# Patient Record
Sex: Female | Born: 1970 | Race: Black or African American | Hispanic: No | Marital: Single | Smoking: Former smoker
Health system: Southern US, Community
[De-identification: ages and names within clinical notes are randomized; demographics above are authoritative.]

## PROBLEM LIST (undated history)

## (undated) DIAGNOSIS — D649 Anemia, unspecified: Secondary | ICD-10-CM

## (undated) DIAGNOSIS — J45909 Unspecified asthma, uncomplicated: Secondary | ICD-10-CM

## (undated) DIAGNOSIS — G43909 Migraine, unspecified, not intractable, without status migrainosus: Secondary | ICD-10-CM

## (undated) HISTORY — PX: FACIAL COSMETIC SURGERY: SHX629

## (undated) HISTORY — DX: Anemia, unspecified: D64.9

## (undated) HISTORY — DX: Unspecified asthma, uncomplicated: J45.909

## (undated) HISTORY — DX: Migraine, unspecified, not intractable, without status migrainosus: G43.909

---

## 2002-01-13 ENCOUNTER — Emergency Department (HOSPITAL_COMMUNITY): Admission: EM | Admit: 2002-01-13 | Discharge: 2002-01-13 | Payer: Self-pay | Admitting: Emergency Medicine

## 2002-01-13 ENCOUNTER — Encounter: Payer: Self-pay | Admitting: Emergency Medicine

## 2002-01-14 ENCOUNTER — Ambulatory Visit (HOSPITAL_COMMUNITY): Admission: RE | Admit: 2002-01-14 | Discharge: 2002-01-14 | Payer: Self-pay | Admitting: Emergency Medicine

## 2002-01-14 ENCOUNTER — Encounter: Payer: Self-pay | Admitting: Emergency Medicine

## 2004-11-29 ENCOUNTER — Emergency Department: Payer: Self-pay | Admitting: Unknown Physician Specialty

## 2008-03-21 ENCOUNTER — Emergency Department: Payer: Self-pay | Admitting: Emergency Medicine

## 2008-04-06 ENCOUNTER — Emergency Department: Payer: Self-pay | Admitting: Emergency Medicine

## 2009-01-06 ENCOUNTER — Emergency Department: Payer: Self-pay | Admitting: Emergency Medicine

## 2009-06-20 ENCOUNTER — Emergency Department: Payer: Self-pay | Admitting: Emergency Medicine

## 2009-12-21 ENCOUNTER — Emergency Department: Payer: Self-pay | Admitting: Emergency Medicine

## 2011-08-17 ENCOUNTER — Emergency Department: Payer: Self-pay | Admitting: Emergency Medicine

## 2011-08-17 LAB — URINALYSIS, COMPLETE
Bilirubin,UR: NEGATIVE
Glucose,UR: NEGATIVE mg/dL (ref 0–75)
Ketone: NEGATIVE
Leukocyte Esterase: NEGATIVE
Nitrite: NEGATIVE
Ph: 5 (ref 4.5–8.0)
Protein: NEGATIVE
RBC,UR: 2 /HPF (ref 0–5)
Specific Gravity: 1.016 (ref 1.003–1.030)
Squamous Epithelial: 3
WBC UR: NONE SEEN /HPF (ref 0–5)

## 2011-08-17 LAB — PREGNANCY, URINE: Pregnancy Test, Urine: NEGATIVE m[IU]/mL

## 2011-12-07 ENCOUNTER — Emergency Department: Payer: Self-pay | Admitting: Unknown Physician Specialty

## 2013-07-31 ENCOUNTER — Emergency Department: Payer: Self-pay | Admitting: Emergency Medicine

## 2013-07-31 LAB — URINALYSIS, COMPLETE
Bacteria: NONE SEEN
Bilirubin,UR: NEGATIVE
Glucose,UR: NEGATIVE mg/dL (ref 0–75)
Ketone: NEGATIVE
Leukocyte Esterase: NEGATIVE
Nitrite: NEGATIVE
Ph: 6 (ref 4.5–8.0)
Protein: NEGATIVE
RBC,UR: <1 /HPF (ref 0–5)
Specific Gravity: 1.001 (ref 1.003–1.030)
Squamous Epithelial: 1
WBC UR: NONE SEEN /HPF (ref 0–5)

## 2013-07-31 LAB — CBC
HCT: 40.2 % (ref 35.0–47.0)
HGB: 13.6 g/dL (ref 12.0–16.0)
MCH: 31.5 pg (ref 26.0–34.0)
MCHC: 33.9 g/dL (ref 32.0–36.0)
MCV: 93 fL (ref 80–100)
Platelet: 163 10*3/uL (ref 150–440)
RBC: 4.33 10*6/uL (ref 3.80–5.20)
RDW: 12.9 % (ref 11.5–14.5)
WBC: 17.4 10*3/uL — ABNORMAL HIGH (ref 3.6–11.0)

## 2013-07-31 LAB — LIPASE, BLOOD: LIPASE: 224 U/L (ref 73–393)

## 2013-07-31 LAB — BASIC METABOLIC PANEL
Anion Gap: 6 — ABNORMAL LOW (ref 7–16)
BUN: 5 mg/dL — ABNORMAL LOW (ref 7–18)
Calcium, Total: 9.2 mg/dL (ref 8.5–10.1)
Chloride: 103 mmol/L (ref 98–107)
Co2: 24 mmol/L (ref 21–32)
Creatinine: 0.95 mg/dL (ref 0.60–1.30)
EGFR (African American): >60
EGFR (Non-African Amer.): >60
Glucose: 101 mg/dL — ABNORMAL HIGH (ref 65–99)
Osmolality: 264 (ref 275–301)
Potassium: 3.3 mmol/L — ABNORMAL LOW (ref 3.5–5.1)
Sodium: 133 mmol/L — ABNORMAL LOW (ref 136–145)

## 2013-07-31 LAB — PREGNANCY, URINE: PREGNANCY TEST, URINE: NEGATIVE m[IU]/mL

## 2013-09-22 ENCOUNTER — Emergency Department: Payer: Self-pay | Admitting: Emergency Medicine

## 2014-12-23 LAB — HM PAP SMEAR: HM Pap smear: NEGATIVE

## 2015-06-09 ENCOUNTER — Encounter: Payer: Self-pay | Admitting: *Deleted

## 2015-06-09 ENCOUNTER — Emergency Department: Payer: No Typology Code available for payment source

## 2015-06-09 ENCOUNTER — Emergency Department
Admission: EM | Admit: 2015-06-09 | Discharge: 2015-06-09 | Disposition: A | Discharge status: Home / Self Care | Payer: No Typology Code available for payment source | Attending: Emergency Medicine | Admitting: Emergency Medicine

## 2015-06-09 DIAGNOSIS — Y939 Activity, unspecified: Secondary | ICD-10-CM | POA: Insufficient documentation

## 2015-06-09 DIAGNOSIS — G44319 Acute post-traumatic headache, not intractable: Secondary | ICD-10-CM | POA: Insufficient documentation

## 2015-06-09 DIAGNOSIS — S022XXA Fracture of nasal bones, initial encounter for closed fracture: Secondary | ICD-10-CM | POA: Insufficient documentation

## 2015-06-09 DIAGNOSIS — Y999 Unspecified external cause status: Secondary | ICD-10-CM | POA: Diagnosis not present

## 2015-06-09 DIAGNOSIS — M7918 Myalgia, other site: Secondary | ICD-10-CM

## 2015-06-09 DIAGNOSIS — S0990XA Unspecified injury of head, initial encounter: Secondary | ICD-10-CM | POA: Diagnosis present

## 2015-06-09 DIAGNOSIS — Y92410 Unspecified street and highway as the place of occurrence of the external cause: Secondary | ICD-10-CM | POA: Diagnosis not present

## 2015-06-09 DIAGNOSIS — S0083XA Contusion of other part of head, initial encounter: Secondary | ICD-10-CM | POA: Diagnosis not present

## 2015-06-09 MED ORDER — TRAMADOL HCL 50 MG PO TABS: 50.0000 mg | ORAL_TABLET | Freq: Four times a day (QID) | ORAL | Status: DC | PRN

## 2015-06-09 MED ORDER — IBUPROFEN 600 MG PO TABS: 600.0000 mg | ORAL_TABLET | Freq: Four times a day (QID) | ORAL | Status: DC | PRN

## 2015-06-09 MED ORDER — CYCLOBENZAPRINE HCL 10 MG PO TABS: 10.0000 mg | ORAL_TABLET | Freq: Three times a day (TID) | ORAL | Status: AC | PRN

## 2015-06-09 MED ORDER — TRAMADOL HCL 50 MG PO TABS
50.0000 mg | ORAL_TABLET | Freq: Once | ORAL | Status: AC
  Administered 2015-06-09: 50 mg via ORAL
  Filled 2015-06-09: qty 1

## 2015-06-09 MED ORDER — ACETAMINOPHEN 325 MG PO TABS
650.0000 mg | ORAL_TABLET | Freq: Once | ORAL | Status: AC
  Administered 2015-06-09: 650 mg via ORAL
  Filled 2015-06-09: qty 2

## 2015-06-09 MED ORDER — CYCLOBENZAPRINE HCL 10 MG PO TABS
10.0000 mg | ORAL_TABLET | Freq: Once | ORAL | Status: AC
  Administered 2015-06-09: 10 mg via ORAL
  Filled 2015-06-09: qty 1
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
## 2015-06-09 NOTE — Discharge Instructions (Signed)
Follow-up with the primary care provider for choice for symptoms that are not improving over the week. Return to the emergency department for symptoms that change or worsen if she unable schedule an appointment.
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
## 2015-06-09 NOTE — ED Notes (Signed)
RN entered room to administer meds and d/c pt. Pt not in room, pt's purse still in room. Will wait for few mins to see if pt returns.

## 2015-06-11 ENCOUNTER — Emergency Department
Admission: EM | Admit: 2015-06-11 | Discharge: 2015-06-11 | Disposition: A | Discharge status: Home / Self Care | Payer: No Typology Code available for payment source | Attending: Emergency Medicine | Admitting: Emergency Medicine

## 2015-06-11 DIAGNOSIS — Y939 Activity, unspecified: Secondary | ICD-10-CM | POA: Diagnosis not present

## 2015-06-11 DIAGNOSIS — Y999 Unspecified external cause status: Secondary | ICD-10-CM | POA: Diagnosis not present

## 2015-06-11 DIAGNOSIS — Z791 Long term (current) use of non-steroidal anti-inflammatories (NSAID): Secondary | ICD-10-CM | POA: Diagnosis not present

## 2015-06-11 DIAGNOSIS — S022XXD Fracture of nasal bones, subsequent encounter for fracture with routine healing: Secondary | ICD-10-CM | POA: Insufficient documentation

## 2015-06-11 DIAGNOSIS — S0083XD Contusion of other part of head, subsequent encounter: Secondary | ICD-10-CM | POA: Diagnosis not present

## 2015-06-11 DIAGNOSIS — S0993XD Unspecified injury of face, subsequent encounter: Secondary | ICD-10-CM | POA: Diagnosis present

## 2015-06-11 DIAGNOSIS — Y92410 Unspecified street and highway as the place of occurrence of the external cause: Secondary | ICD-10-CM | POA: Insufficient documentation

## 2015-06-11 MED ORDER — OXYCODONE-ACETAMINOPHEN 5-325 MG PO TABS
1.0000 | ORAL_TABLET | Freq: Once | ORAL | Status: AC
  Administered 2015-06-11: 1 via ORAL
  Filled 2015-06-11: qty 1

## 2015-06-11 MED ORDER — OXYCODONE-ACETAMINOPHEN 10-325 MG PO TABS: 1.0000 | ORAL_TABLET | Freq: Four times a day (QID) | ORAL | Status: AC | PRN
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
## 2015-06-28 ENCOUNTER — Emergency Department
Admission: EM | Admit: 2015-06-28 | Discharge: 2015-06-28 | Disposition: A | Discharge status: Home / Self Care | Payer: No Typology Code available for payment source

## 2015-06-29 ENCOUNTER — Emergency Department
Admission: EM | Admit: 2015-06-29 | Discharge: 2015-06-29 | Disposition: A | Discharge status: Home / Self Care | Payer: No Typology Code available for payment source | Attending: Emergency Medicine | Admitting: Emergency Medicine

## 2015-06-29 DIAGNOSIS — M549 Dorsalgia, unspecified: Secondary | ICD-10-CM | POA: Diagnosis present

## 2015-06-29 DIAGNOSIS — Z5321 Procedure and treatment not carried out due to patient leaving prior to being seen by health care provider: Secondary | ICD-10-CM | POA: Diagnosis not present
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
## 2015-08-17 ENCOUNTER — Emergency Department
Admission: EM | Admit: 2015-08-17 | Discharge: 2015-08-17 | Disposition: A | Discharge status: Home / Self Care | Payer: Self-pay | Attending: Emergency Medicine | Admitting: Emergency Medicine

## 2015-08-17 ENCOUNTER — Encounter: Payer: Self-pay | Admitting: Emergency Medicine

## 2015-08-17 ENCOUNTER — Emergency Department: Payer: Self-pay

## 2015-08-17 DIAGNOSIS — Z79899 Other long term (current) drug therapy: Secondary | ICD-10-CM | POA: Insufficient documentation

## 2015-08-17 DIAGNOSIS — M25511 Pain in right shoulder: Secondary | ICD-10-CM | POA: Insufficient documentation

## 2015-08-17 DIAGNOSIS — Z791 Long term (current) use of non-steroidal anti-inflammatories (NSAID): Secondary | ICD-10-CM | POA: Insufficient documentation

## 2015-08-17 DIAGNOSIS — M7918 Myalgia, other site: Secondary | ICD-10-CM

## 2015-08-17 DIAGNOSIS — M791 Myalgia: Secondary | ICD-10-CM | POA: Insufficient documentation

## 2015-08-17 MED ORDER — KETOROLAC TROMETHAMINE 60 MG/2ML IM SOLN
30.0000 mg | Freq: Once | INTRAMUSCULAR | Status: AC
  Administered 2015-08-17: 30 mg via INTRAMUSCULAR
  Filled 2015-08-17: qty 2

## 2015-08-17 MED ORDER — KETOROLAC TROMETHAMINE 10 MG PO TABS: 10.0000 mg | ORAL_TABLET | Freq: Three times a day (TID) | ORAL | Status: DC

## 2015-08-17 MED ORDER — CYCLOBENZAPRINE HCL 5 MG PO TABS: 5.0000 mg | ORAL_TABLET | Freq: Three times a day (TID) | ORAL | Status: DC | PRN

## 2015-08-17 NOTE — Discharge Instructions (Signed)
Your shoulder exam and neck x-rays are normal. You likely have some muscle strain in the upper back causing some irritation of the shoulder and nerves in the upper back. Take the prescription meds as directed. Apply ice and heat compresses for pain relief. Follow-up with your chiropractor weekly for adjustments. Consider seeing Dr. Rosita Kea (ortho) if symptoms continue.  Cryotherapy Cryotherapy is when you put ice on your injury. Ice helps lessen pain and puffiness (swelling) after an injury. Ice works the best when you start using it in the first 24 to 48 hours after an injury. HOME CARE  Put a dry or damp towel between the ice pack and your skin.  You may press gently on the ice pack.  Leave the ice on for no more than 10 to 20 minutes at a time.  Check your skin after 5 minutes to make sure your skin is okay.  Rest at least 20 minutes between ice pack uses.  Stop using ice when your skin loses feeling (numbness).  Do not use ice on someone who cannot tell you when it hurts. This includes small children and people with memory problems (dementia). GET HELP RIGHT AWAY IF:  You have white spots on your skin.  Your skin turns blue or pale.  Your skin feels waxy or hard.  Your puffiness gets worse. MAKE SURE YOU:   Understand these instructions.  Will watch your condition.  Will get help right away if you are not doing well or get worse.   This information is not intended to replace advice given to you by your health care provider. Make sure you discuss any questions you have with your health care provider.   Document Released: 07/12/2007 Document Revised: 04/17/2011 Document Reviewed: 09/15/2010 Elsevier Interactive Patient Education 2016 Elsevier Inc[employer]  [employer] Therapy Heat therapy can help ease sore, stiff, injured, and tight muscles and joints. Heat relaxes your muscles, which may help ease your pain. Heat therapy should only be used on old, pre-existing, or long-lasting (chronic)  injuries. Do not use heat therapy unless told by your doctor. HOW TO USE HEAT THERAPY There are several different kinds of heat therapy, including:  Moist heat pack.  Warm water bath.  Hot water bottle.  Electric heating pad.  Heated gel pack.  Heated wrap.  Electric heating pad. GENERAL HEAT THERAPY RECOMMENDATIONS   Do not sleep while using heat therapy. Only use heat therapy while you are awake.  Your skin may turn pink while using heat therapy. Do not use heat therapy if your skin turns red.  Do not use heat therapy if you have new pain.  High heat or long exposure to heat can cause burns. Be careful when using heat therapy to avoid burning your skin.  Do not use heat therapy on areas of your skin that are already irritated, such as with a rash or sunburn. GET HELP IF:   You have blisters, redness, swelling (puffiness), or numbness.  You have new pain.  Your pain is worse. MAKE SURE YOU:  Understand these instructions.  Will watch your condition.  Will get help right away if you are not doing well or get worse.   This information is not intended to replace advice given to you by your health care provider. Make sure you discuss any questions you have with your health care provider.   Document Released: 04/17/2011 Document Revised: 02/13/2014 Document Reviewed: 03/18/2013 Elsevier Interactive Patient Education 2016 Elsevier Inc.  Muscle Pain, Adult Muscle pain (myalgia)  may be caused by many things, including:  Overuse or muscle strain, especially if you are not in shape. This is the most common cause of muscle pain.  Injury.  Bruises.  Viruses, such as the flu.  Infectious diseases.  Fibromyalgia, which is a chronic condition that causes muscle tenderness, fatigue, and headache.  Autoimmune diseases, including lupus.  Certain drugs, including ACE inhibitors and statins. Muscle pain may be mild or severe. In most cases, the pain lasts only a short  time and goes away without treatment. To diagnose the cause of your muscle pain, your health care provider will take your medical history. This means he or she will ask you when your muscle pain began and what has been happening. If you have not had muscle pain for very long, your health care provider may want to wait before doing much testing. If your muscle pain has lasted a long time, your health care provider may want to run tests right away. If your health care provider thinks your muscle pain may be caused by illness, you may need to have additional tests to rule out certain conditions.  Treatment for muscle pain depends on the cause. Home care is often enough to relieve muscle pain. Your health care provider may also prescribe anti-inflammatory medicine. HOME CARE INSTRUCTIONS Watch your condition for any changes. The following actions may help to lessen any discomfort you are feeling:  Only take over-the-counter or prescription medicines as directed by your health care provider.  Apply ice to the sore muscle:  Put ice in a plastic bag.  Place a towel between your skin and the bag.  Leave the ice on for 15-20 minutes, 3-4 times a day.  You may alternate applying hot and cold packs to the muscle as directed by your health care provider.  If overuse is causing your muscle pain, slow down your activities until the pain goes away.  Remember that it is normal to feel some muscle pain after starting a workout program. Muscles that have not been used often will be sore at first.  Do regular, gentle exercises if you are not usually active.  Warm up before exercising to lower your risk of muscle pain.  Do not continue working out if the pain is very bad. Bad pain could mean you have injured a muscle. SEEK MEDICAL CARE IF:  Your muscle pain gets worse, and medicines do not help.  You have muscle pain that lasts longer than 3 days.  You have a rash or fever along with muscle pain.  You  have muscle pain after a tick bite.  You have muscle pain while working out, even though you are in good physical condition.  You have redness, soreness, or swelling along with muscle pain.  You have muscle pain after starting a new medicine or changing the dose of a medicine. SEEK IMMEDIATE MEDICAL CARE IF:  You have trouble breathing.  You have trouble swallowing.  You have muscle pain along with a stiff neck, fever, and vomiting.  You have severe muscle weakness or cannot move part of your body. MAKE SURE YOU:   Understand these instructions.  Will watch your condition.  Will get help right away if you are not doing well or get worse.   This information is not intended to replace advice given to you by your health care provider. Make sure you discuss any questions you have with your health care provider.   Document Released: 12/15/2005 Document Revised: 02/13/2014 Document  Reviewed: 11/19/2012 Elsevier Interactive Patient Education 2016 Elsevier Inc.  Shoulder Pain The shoulder is the joint that connects your arm to your body. Muscles and band-like tissues that connect bones to muscles (tendons) hold the joint together. Shoulder pain is felt if an injury or medical problem affects one or more parts of the shoulder. HOME CARE   Put ice on the sore area.  Put ice in a plastic bag.  Place a towel between your skin and the bag.  Leave the ice on for 15-20 minutes, 03-04 times a day for the first 2 days.  Stop using cold packs if they do not help with the pain.  If you were given something to keep your shoulder from moving (sling; shoulder immobilizer), wear it as told. Only take it off to shower or bathe.  Move your arm as little as possible, but keep your hand moving to prevent puffiness (swelling).  Squeeze a soft ball or foam pad as much as possible to help prevent swelling.  Take medicine as told by your doctor. GET HELP IF:  You have progressing new pain in  your arm, hand, or fingers.  Your hand or fingers get cold.  Your medicine does not help lessen your pain. GET HELP RIGHT AWAY IF:   Your arm, hand, or fingers are numb or tingling.  Your arm, hand, or fingers are puffy (swollen), painful, or turn white or blue. MAKE SURE YOU:   Understand these instructions.  Will watch your condition.  Will get help right away if you are not doing well or get worse.   This information is not intended to replace advice given to you by your health care provider. Make sure you discuss any questions you have with your health care provider.   Document Released: 07/12/2007 Document Revised: 02/13/2014 Document Reviewed: 05/18/2014 Elsevier Interactive Patient Education [employer]

## 2015-08-17 NOTE — ED Notes (Signed)
Pt to ed with c/o right shoulder pain since sat.  Reports pain feels like it is pinching.
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
## 2016-11-19 IMAGING — CR DG CERVICAL SPINE COMPLETE 4+V
1 series · 7 of 7 positions shown · non-contrast
Comparison: None.

CLINICAL DATA: Right upper extremity paresthesias 3

EXAM:
CERVICAL SPINE - COMPLETE 4+ VIEW

[Series 1: dg cervical spine complete · 0.14mm/px · 7 of 7 slices shown]
[im 1/7]
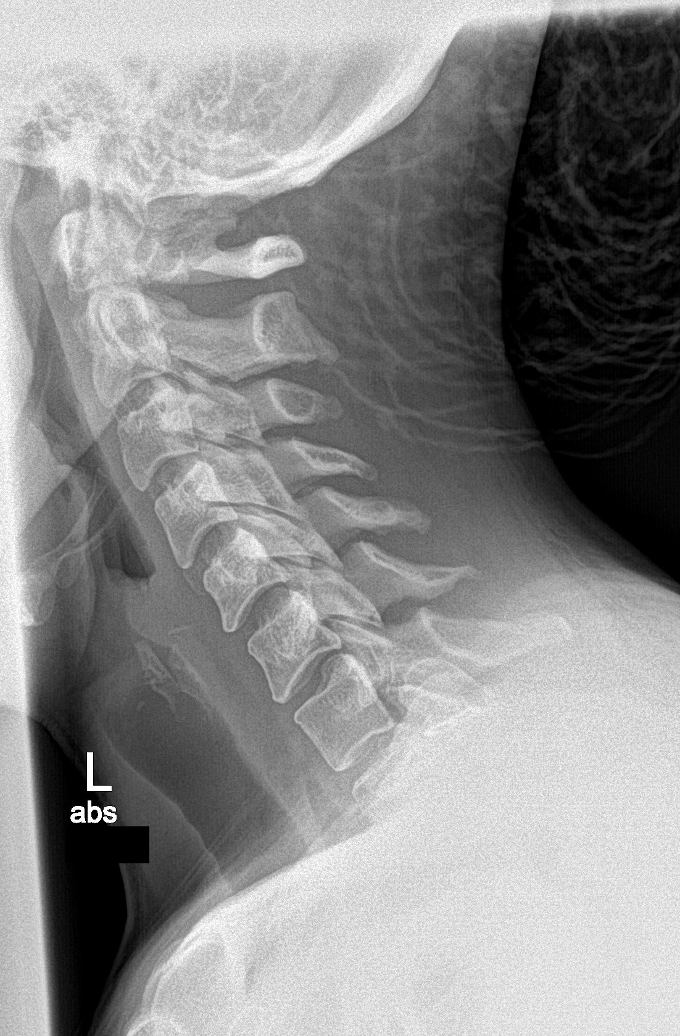
[im 2/7]
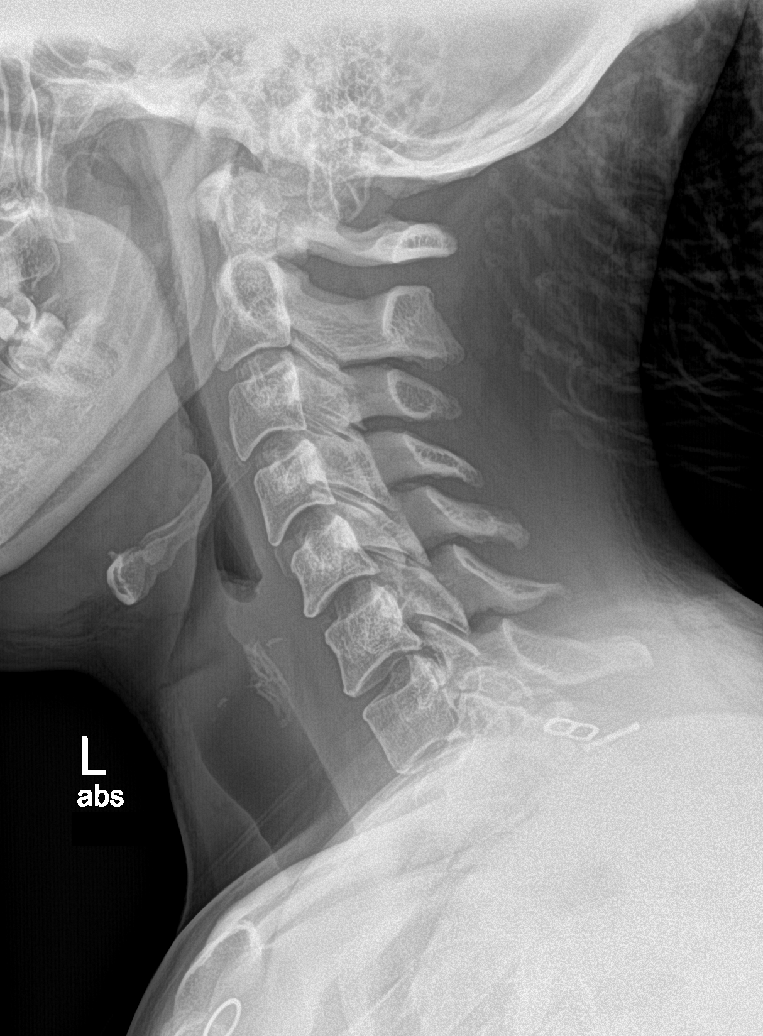
[im 3/7]
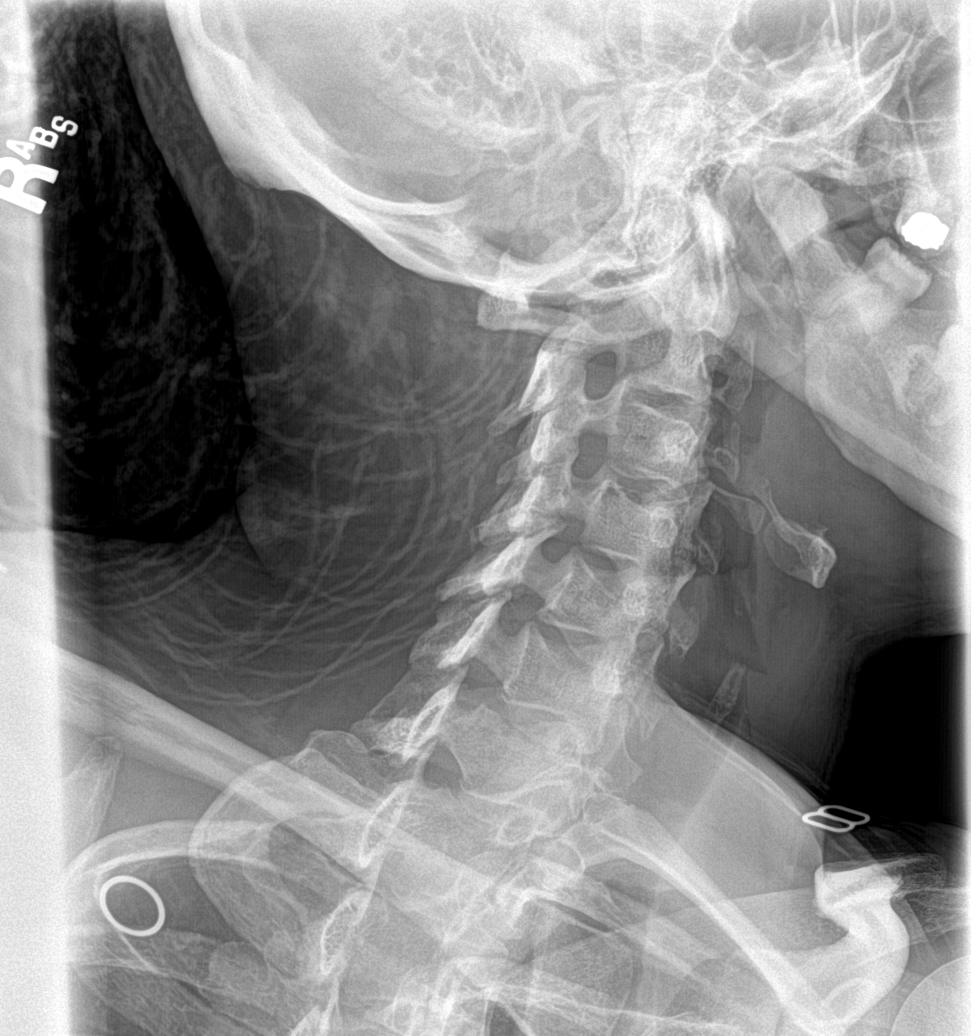
[im 4/7]
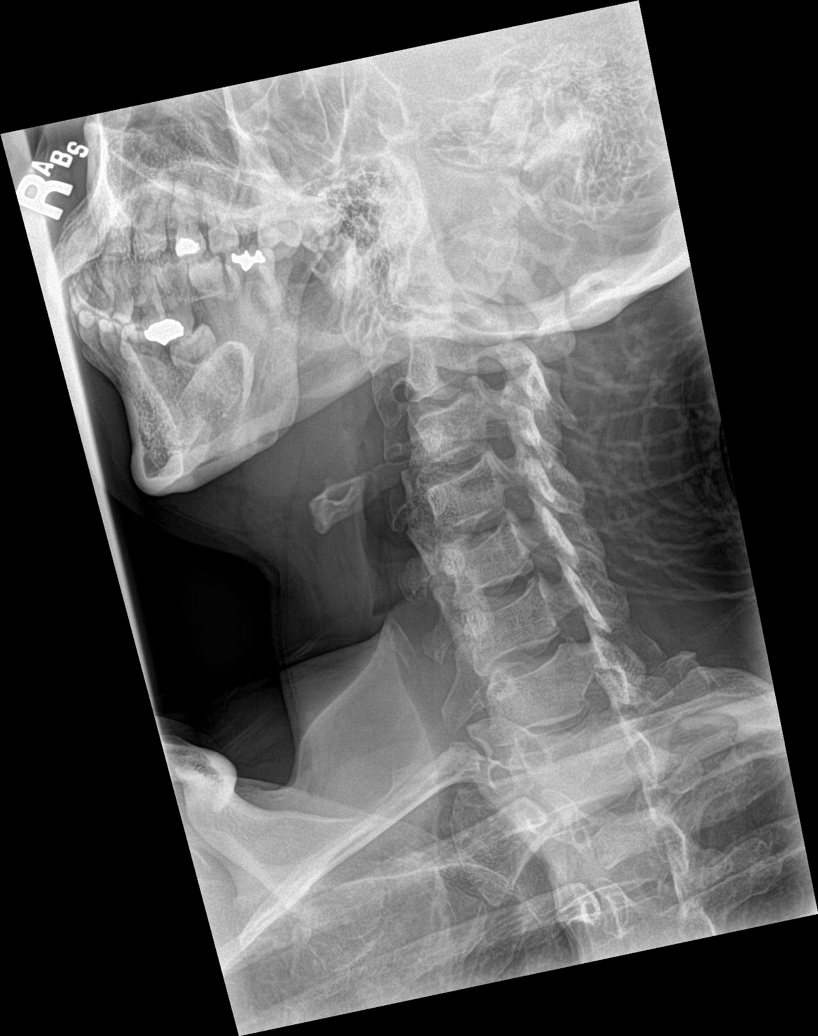
[im 5/7]
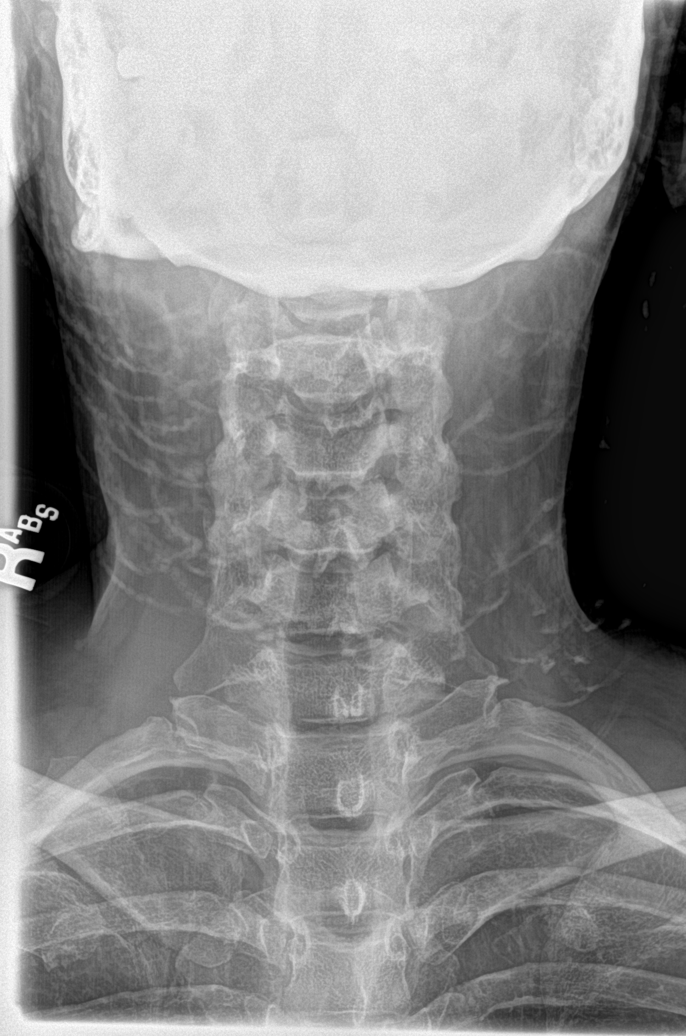
[im 6/7]
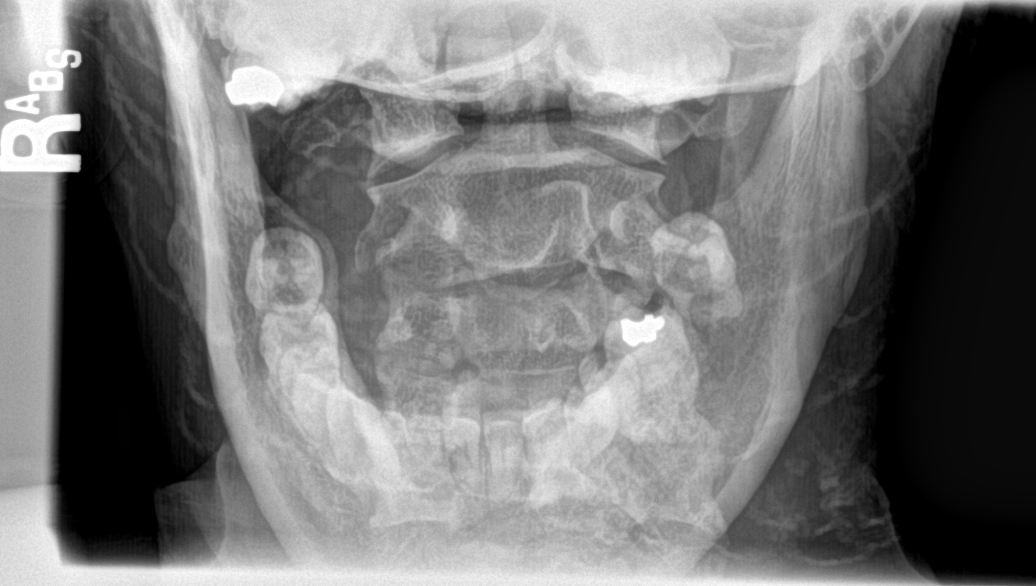
[im 7/7]
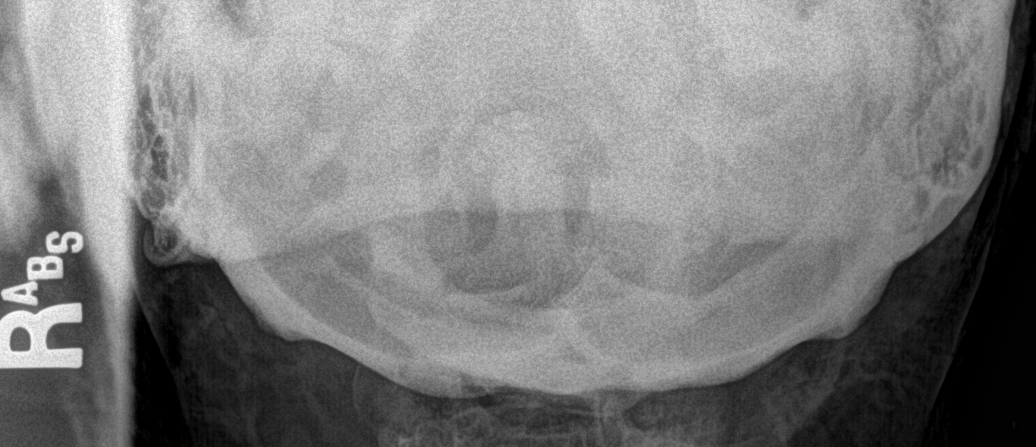

[7 of 7 positions shown; findings below may reference images not displayed]

FINDINGS: There is no evidence of cervical spine fracture or prevertebral soft
tissue swelling. Alignment is normal. No other significant bone
abnormalities are identified.
IMPRESSION: Negative cervical spine radiographs.

## 2016-12-29 ENCOUNTER — Emergency Department
Admission: EM | Admit: 2016-12-29 | Discharge: 2016-12-29 | Disposition: A | Discharge status: Home / Self Care | Payer: Self-pay | Attending: Emergency Medicine | Admitting: Emergency Medicine

## 2016-12-29 DIAGNOSIS — M62830 Muscle spasm of back: Secondary | ICD-10-CM | POA: Insufficient documentation

## 2016-12-29 LAB — POCT PREGNANCY, URINE: PREG TEST UR: NEGATIVE

## 2016-12-29 LAB — URINALYSIS, COMPLETE (UACMP) WITH MICROSCOPIC
BACTERIA UA: NONE SEEN
BILIRUBIN URINE: NEGATIVE
Glucose, UA: NEGATIVE mg/dL
KETONES UR: NEGATIVE mg/dL
LEUKOCYTES UA: NEGATIVE
NITRITE: NEGATIVE
PH: 6 (ref 5.0–8.0)
Protein, ur: NEGATIVE mg/dL
RBC / HPF: NONE SEEN RBC/hpf (ref 0–5)
Specific Gravity, Urine: 1.002 — ABNORMAL LOW (ref 1.005–1.030)

## 2016-12-29 MED ORDER — OXYCODONE-ACETAMINOPHEN 5-325 MG PO TABS: 1.0000 | ORAL_TABLET | Freq: Four times a day (QID) | ORAL | 0 refills | Status: AC | PRN

## 2016-12-29 MED ORDER — METHOCARBAMOL 500 MG PO TABS
1000.0000 mg | ORAL_TABLET | Freq: Once | ORAL | Status: AC
  Administered 2016-12-29: 1000 mg via ORAL
  Filled 2016-12-29: qty 2

## 2016-12-29 MED ORDER — METHOCARBAMOL 500 MG PO TABS: ORAL_TABLET | ORAL | 0 refills | Status: AC

## 2016-12-29 MED ORDER — KETOROLAC TROMETHAMINE 30 MG/ML IJ SOLN
30.0000 mg | Freq: Once | INTRAMUSCULAR | Status: AC
  Administered 2016-12-29: 30 mg via INTRAMUSCULAR
  Filled 2016-12-29: qty 1

## 2016-12-29 MED ORDER — OXYCODONE-ACETAMINOPHEN 5-325 MG PO TABS
1.0000 | ORAL_TABLET | Freq: Once | ORAL | Status: AC
  Administered 2016-12-29: 1 via ORAL
  Filled 2016-12-29: qty 1

## 2016-12-29 MED ORDER — NAPROXEN 500 MG PO TABS: 500.0000 mg | ORAL_TABLET | Freq: Two times a day (BID) | ORAL | 0 refills | Status: AC

## 2016-12-29 NOTE — Discharge Instructions (Signed)
Follow-up with her primary care provider worker no clinic if any continued problems. Take medication only as directed. Naproxen 500 mg twice a day with food, Robaxin one or 2 tablets every 6 hours as needed for muscle spasms and Percocet 1 every 6 hours if needed for severe pain. May also use ice or heat to muscles as needed. Do not drive or operate machinery while taking muscle relaxant or pain medication.

## 2016-12-29 NOTE — ED Triage Notes (Signed)
Pt arrived to the ED POV d/t left sided back pain that started this morning while at work. Pt describes it as a sharpe nerve pain with any movement. Pt denies any radiating pain. Pt rates her pain a 10 on a 1-10 scale at this time.
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
## 2016-12-29 NOTE — ED Notes (Signed)
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  Patient states she feels like she twisted wrong somehow and hurt her back.

## 2017-05-23 LAB — HM HIV SCREENING LAB: HM HIV Screening: NEGATIVE

## 2018-09-11 ENCOUNTER — Other Ambulatory Visit: Payer: Self-pay

## 2018-09-11 ENCOUNTER — Ambulatory Visit (LOCAL_COMMUNITY_HEALTH_CENTER): Payer: Self-pay

## 2018-09-11 VITALS — BP 120/74 | Ht 73.0 in | Wt 181.5 lb

## 2018-09-11 DIAGNOSIS — Z30013 Encounter for initial prescription of injectable contraceptive: Secondary | ICD-10-CM

## 2018-09-11 DIAGNOSIS — Z3042 Encounter for surveillance of injectable contraceptive: Secondary | ICD-10-CM

## 2018-09-11 DIAGNOSIS — Z3009 Encounter for other general counseling and advice on contraception: Secondary | ICD-10-CM

## 2018-09-11 MED ORDER — MEDROXYPROGESTERONE ACETATE 150 MG/ML IM SUSP
150.0000 mg | Freq: Once | INTRAMUSCULAR | Status: AC
  Administered 2018-09-11: 14:00:00 150 mg via INTRAMUSCULAR

## 2018-09-11 NOTE — Progress Notes (Signed)
Last physical at [facility] 05/23/2017. Last depo at [facility] 06/17/2018; 12.2 weeks post depo. Consulted with provider regarding pt request to continue depo and past due physical. Per verbal order by Antoine Primas, PA, DMPA 150 mg IM given toda.

## 2018-12-25 ENCOUNTER — Ambulatory Visit (LOCAL_COMMUNITY_HEALTH_CENTER): Payer: Self-pay

## 2018-12-25 ENCOUNTER — Other Ambulatory Visit: Payer: Self-pay

## 2018-12-25 VITALS — BP 120/82 | Ht 73.0 in | Wt 187.0 lb

## 2018-12-25 DIAGNOSIS — Z3009 Encounter for other general counseling and advice on contraception: Secondary | ICD-10-CM

## 2018-12-25 MED ORDER — MULTI-VITAMIN/MINERALS PO TABS: 1.0000 | ORAL_TABLET | Freq: Every day | ORAL | 0 refills | Status: AC

## 2018-12-25 MED ORDER — MEDROXYPROGESTERONE ACETATE 150 MG/ML IM SUSP
150.0000 mg | Freq: Once | INTRAMUSCULAR | Status: AC
  Administered 2018-12-25: 150 mg via INTRAMUSCULAR

## 2018-12-25 NOTE — Progress Notes (Signed)
[facility], but no current order for Depo administration. Consult with Hassell Done FNP and following verbal orders given: 1) Administer Depo 150 mg IM x1 today and 2) client to schedule physical appt when Depo due 03/12/2019. If agency not yet scheduling physicals at that time, client to schedule appt with provider to discuss BCM.  Rich Number, RN

## 2019-01-20 ENCOUNTER — Other Ambulatory Visit: Payer: Self-pay

## 2019-01-20 ENCOUNTER — Ambulatory Visit: Payer: Self-pay | Admitting: Family Medicine

## 2019-01-20 ENCOUNTER — Encounter: Payer: Self-pay | Admitting: Family Medicine

## 2019-01-20 VITALS — BP 135/77 | Ht 73.0 in | Wt 184.0 lb

## 2019-01-20 DIAGNOSIS — Z113 Encounter for screening for infections with a predominantly sexual mode of transmission: Secondary | ICD-10-CM

## 2019-01-20 DIAGNOSIS — N9089 Other specified noninflammatory disorders of vulva and perineum: Secondary | ICD-10-CM

## 2019-01-20 DIAGNOSIS — Z3009 Encounter for other general counseling and advice on contraception: Secondary | ICD-10-CM

## 2019-01-20 DIAGNOSIS — A630 Anogenital (venereal) warts: Secondary | ICD-10-CM

## 2019-01-20 LAB — WET PREP FOR TRICH, YEAST, CLUE
Trichomonas Exam: NEGATIVE
Yeast Exam: NEGATIVE

## 2019-01-20 MED ORDER — MEDROXYPROGESTERONE ACETATE 150 MG/ML IM SUSP
150.0000 mg | INTRAMUSCULAR | Status: AC
  Administered 2019-12-15: 150 mg via INTRAMUSCULAR

## 2019-01-20 NOTE — Progress Notes (Signed)
Wet mount reviewed, no tx per standing order. Depo order put in today for future, not due today. Provider orders completed.

## 2019-01-20 NOTE — Progress Notes (Signed)
Family Planning Visit  Subjective:  Stacey Guerra is a 48 y.o. being seen today for  Chief Complaint  Patient presents with  . Gynecologic Exam    c/o genital warts  . SEXUALLY TRANSMITTED DISEASE    STD screening, desires bloodwork also  .      does not have a problem list on file.   Pt is here for yearly exam for depo renewal order, last injection 4 wks ago (not due today). Has been using this for 4-5 years.   Patient also reports her genital warts have returned. She has had freeze treatment several years ago for this. Would like them refrozen today.   Also states she has had a painful area on R labia. Was red, possibly drained liquid, now resolving.     Pt is currently using depo for pregnancy prevention. Patient reports they do not want a pregnancy in the next year.   No LMP recorded. Patient has had an injection.  They do desire STI screening.  Last pap: 2016- next due 12/2019   See flowsheet for other program required questions.   48 y.o., Body mass index is 24.28 kg/m. - Is patient eligible for HA1C diabetes screening based on BMI and age >10? No  Does the patient have a current or past history of drug use? y No components found for: HCV]   Health Maintenance Due  Topic Date Due  . TETANUS/TDAP  08/22/1989  . INFLUENZA VACCINE  09/07/2018    ROS  The following portions of the patient's history were reviewed and updated as appropriate: allergies, current medications, past family history, past medical history, past social history, past surgical history and problem list. Problem list updated.  Objective:  BP 135/77   Ht 6\' 1"  (1.854 m)   Wt 184 lb (83.5 kg)   BMI 24.28 kg/m   Gen: well appearing, NAD HEENT: no scleral icterus CV: RR Lung: Normal WOB Breast: symetrical, no masses, tenderness, lesions, nipple discharge or erythema bilaterally.  GU: R labia with ~1cm healed lesion, mildly erythematous base. L lower labia w/~.25cm wart. L perianal  area with ~.5cm wart.  Ext: warm well perfused Lymphadenopathy: no lymphadenopathy  PELVIC: Normal appearing vaginal mucosa and cervix.  No abnormal discharge noted.  Normal uterine size, no other palpable masses, no uterine or adnexal tenderness.  Physical Exam Vitals and nursing note reviewed.  Constitutional:      Appearance: Normal appearance.  HENT:     Head: Normocephalic and atraumatic.     Mouth/Throat:     Mouth: Mucous membranes are moist.     Pharynx: Oropharynx is clear. No oropharyngeal exudate or posterior oropharyngeal erythema.  Pulmonary:     Effort: Pulmonary effort is normal.  Chest:     Breasts: Breasts are symmetrical.        Right: Normal. No swelling, bleeding, inverted nipple, mass, nipple discharge, skin change or tenderness.        Left: Normal. No swelling, bleeding, inverted nipple, mass, nipple discharge, skin change or tenderness.  Abdominal:     General: Abdomen is flat.     Palpations: There is no mass.     Tenderness: There is no abdominal tenderness. There is no rebound.  Genitourinary:    General: Normal vulva.     Exam position: Lithotomy position.     Pubic Area: No rash or pubic lice.      Labia:  R labia with ~1cm healed ulcer with raised edges, mildly erythematous base.  L lower labia w/~.25cm soft, smooth flesh colored growth.     Right: No rash or lesion.        Left: No rash or lesion.      Vagina: Normal. No vaginal discharge, erythema, bleeding or lesions.     Cervix: No cervical motion tenderness, discharge, friability, lesion or erythema.     Uterus: Normal.      Adnexa: Right adnexa normal and left adnexa normal.     Rectum: L perianal area with ~.5cm soft, smooth flesh colored growth.  Lymphadenopathy:     Head:     Right side of head: No preauricular or posterior auricular adenopathy.     Left side of head: No preauricular or posterior auricular adenopathy.     Cervical: No cervical adenopathy.     Upper Body:     Right  upper body: No supraclavicular or axillary adenopathy.     Left upper body: No supraclavicular or axillary adenopathy.     Lower Body: No right inguinal adenopathy. No left inguinal adenopathy.  Skin:    General: Skin is warm and dry.     Findings: No rash.  Neurological:     Mental Status: She is alert and oriented to person, place, and time.     Assessment and Plan:  Stacey Guerra is a 48 y.o. female presenting [facility] for a well woman exam/family planning visit  Contraception counseling: Reviewed all forms of birth control options in the tiered based approach. available including abstinence; over the counter/barrier methods; hormonal contraceptive medication including pill, patch, ring, injection,contraceptive implant; hormonal and nonhormonal IUDs; permanent sterilization options including vasectomy and the various tubal sterilization modalities. Risks, benefits, how to discontinue and typical effectiveness rates were reviewed.  Questions were answered.  Written information was also given to the patient to review.  Patient desires depo, this was prescribed for patient. She will follow up in  1 year for surveillance.  She was told to call with any further questions, or with any concerns about this method of contraception.  Emphasized use of condoms 100% of the time for STI prevention.   1. Screening examination for venereal disease -Screenings today as below. Treat wet prep per standing order -Patient does meet criteria for HepB, HepC Screening. Accepts these screenings. -Counseled on warning s/sx and when to seek care. Recommended condom use with all sex. - WET PREP FOR TRICH, YEAST, CLUE - Syphilis Serology, [place] [facility] - Chlamydia/Gonorrhea [place] [facility] - HIV/HCV [place] [facility] - HBV Antigen/Antibody [facility]  2. Genital warts -Unclear if these are warts vs skin tags, but as she has received dx of warts and they have responded to cryo therapy in  the past will tx similarly today.  -Cryo treatment in 3 freeze/thaw cycles today.  Patient tolerated procedure well. Reviewed with patient after-care instructions and when to call clinic. No sex until area has completely healed. Rec condoms with all sex. RTC in 10-14 days for next treatment if needed.  3. 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 As it is dry and well healed, nothing to unroof, I did not culture today, but we discussed possible diagnosis and I advised pt to RTC if new lesions appear for testing.   4. Family planning services Rx depo x1 yr.  -Risks of Depo use >2 yrs discussed with pt. Alternative BCM discussed, pt would like to continue Depo. Suggested  calcium (1200 mg daily) and vitamin D (800 IU daily) supplements, weight bearing exercise and avoiding cigarette smoking and excessive alcohol consumption to promote bone health.  -Follow up in 3 months for repeat Depo Provera injection.  - medroxyPROGESTERone (DEPO-PROVERA) injection 150 mg     Return in about 3 months (around 04/20/2019) for Depo.  No future appointments.  Kandee Keen, PA-C

## 2019-01-20 NOTE — Progress Notes (Signed)
Pt states she has had genital warts reappear on genital area and buttocks. Wants to discuss with provider and possible tx options. Also desires STD screenings.

## 2019-01-27 LAB — HEPATITIS B SURFACE ANTIGEN

## 2019-01-29 LAB — HM HEPATITIS C SCREENING LAB: HM Hepatitis Screen: NEGATIVE

## 2019-01-29 LAB — HM HIV SCREENING LAB: HM HIV Screening: NEGATIVE

## 2019-12-15 ENCOUNTER — Ambulatory Visit: Payer: Self-pay

## 2019-12-15 ENCOUNTER — Ambulatory Visit (LOCAL_COMMUNITY_HEALTH_CENTER): Payer: Self-pay | Admitting: Family Medicine

## 2019-12-15 ENCOUNTER — Other Ambulatory Visit: Payer: Self-pay

## 2019-12-15 ENCOUNTER — Encounter: Payer: Self-pay | Admitting: Advanced Practice Midwife

## 2019-12-15 VITALS — BP 119/72 | Ht 73.0 in | Wt 180.2 lb

## 2019-12-15 DIAGNOSIS — Z716 Tobacco abuse counseling: Secondary | ICD-10-CM

## 2019-12-15 DIAGNOSIS — Z3009 Encounter for other general counseling and advice on contraception: Secondary | ICD-10-CM

## 2019-12-15 DIAGNOSIS — F17210 Nicotine dependence, cigarettes, uncomplicated: Secondary | ICD-10-CM | POA: Insufficient documentation

## 2019-12-15 DIAGNOSIS — Z30013 Encounter for initial prescription of injectable contraceptive: Secondary | ICD-10-CM

## 2019-12-15 NOTE — Progress Notes (Signed)
Client had eye surgery in [place], [place] on 11/28/2019. Negative Covid test results prior to surgery. Jossie Ng, RN  [facility] card given to client. Client tolerated Depo without difficulty. Depo administered per 01/20/2019 written order of Samara Snide PA-C per Wendi Snipes NP. Client tolerated without complaint. Jossie Ng, RN

## 2019-12-15 NOTE — Progress Notes (Addendum)
   [facility]  [facility]  Subjective:  Stacey Guerra is a 49 y.o. being seen today for    Chief Complaint  Patient presents with  . Contraception    Depo    HPI   Patient in clinic for restart of depo.  Reports stopping depo last year to see if depo was causing weight gain.  Denies any trouble with depo in the past.  Last PE was 01/20/2019, last Depo was 12/25/2018, last sex was 11/2018.  Patient is interested in smoking cessation.     Patient has no other problems or concerns today.    Does the patient have a current or past history of drug use? Yes   No components found for: HCV]   Health Maintenance Due  Topic Date Due  . COVID-19 Vaccine (1) Never done  . TETANUS/TDAP  Never done  . INFLUENZA VACCINE  Never done  . PAP SMEAR-Modifier  12/23/2019    Review of Systems  Constitutional: Negative.     The following portions of the patient's history were reviewed and updated as appropriate: allergies, current medications, past family history, past medical history, past social history, past surgical history and problem list. Problem list updated.   See flowsheet for other program required questions.  Objective:   Vitals:   12/15/19 1305  BP: 119/72  Weight: 180 lb 3.2 oz (81.7 kg)  Height: 6\' 1"  (1.854 m)    Physical Exam  Deferred until 01/21/2020 ,  physical and PAP due.     Assessment and Plan:  Stacey Guerra is a 49 y.o. female presenting to the [facility] Department for a Women's Health problem visit  There are no diagnoses linked to this encounter.   1. Family Planning Counseling  Counseled on depo most effective every 11-13 weeks,   May have abnormal bleeding or spotting for first few weeks   after restarting depo.   Condoms for back up with all sex for next 7 days.      Physical and PAP due  01/21/2020    OK to give Depo provera 150 mg IM per J. 01/23/2020 01/20/2019 order    2.  Smoking cessation        Patient currently smoking 1 ppd .  Patient reports interest in smoking cessation.  Provided 3-10 minutes of tobacco cessation counseling using motivational counseling  Please Give 1-800- QUITLINE card today   Return in about 5 weeks (around 01/21/2020) for annual well woman exam.  No future appointments.  01/23/2020, FNP

## 2019-12-16 ENCOUNTER — Ambulatory Visit (LOCAL_COMMUNITY_HEALTH_CENTER): Payer: Self-pay

## 2019-12-16 DIAGNOSIS — Z111 Encounter for screening for respiratory tuberculosis: Secondary | ICD-10-CM

## 2019-12-19 ENCOUNTER — Other Ambulatory Visit: Payer: Self-pay

## 2019-12-19 ENCOUNTER — Ambulatory Visit (LOCAL_COMMUNITY_HEALTH_CENTER): Payer: Self-pay

## 2019-12-19 DIAGNOSIS — Z111 Encounter for screening for respiratory tuberculosis: Secondary | ICD-10-CM

## 2019-12-19 LAB — TB SKIN TEST
Induration: 0 mm
TB Skin Test: NEGATIVE

## 2020-03-09 ENCOUNTER — Ambulatory Visit (LOCAL_COMMUNITY_HEALTH_CENTER): Payer: Self-pay

## 2020-03-09 ENCOUNTER — Other Ambulatory Visit: Payer: Self-pay

## 2020-03-09 VITALS — BP 123/77 | Ht 73.0 in | Wt 180.5 lb

## 2020-03-09 DIAGNOSIS — Z30013 Encounter for initial prescription of injectable contraceptive: Secondary | ICD-10-CM

## 2020-03-09 DIAGNOSIS — Z3009 Encounter for other general counseling and advice on contraception: Secondary | ICD-10-CM

## 2020-03-09 MED ORDER — MEDROXYPROGESTERONE ACETATE 150 MG/ML IM SUSP
150.0000 mg | Freq: Once | INTRAMUSCULAR | Status: AC
  Administered 2020-03-09: 150 mg via INTRAMUSCULAR

## 2020-03-09 NOTE — Progress Notes (Signed)
DMPA 150 mg given IM (Left Deltoid) today per Lyndel Safe, MD S.O until physical (due 01/2020).  Patient requested a physical and PAP today, but was only scheduled for a Depo.  Patient to schedule a physical and PAP in April and she knows her Depo will be given at that visit. Hart Carwin, RN

## 2020-06-07 ENCOUNTER — Encounter: Payer: Self-pay | Admitting: Physician Assistant

## 2020-06-07 ENCOUNTER — Ambulatory Visit (LOCAL_COMMUNITY_HEALTH_CENTER): Payer: Self-pay | Admitting: Physician Assistant

## 2020-06-07 ENCOUNTER — Other Ambulatory Visit: Payer: Self-pay

## 2020-06-07 VITALS — BP 112/69 | Ht 72.0 in | Wt 180.6 lb

## 2020-06-07 DIAGNOSIS — Z3042 Encounter for surveillance of injectable contraceptive: Secondary | ICD-10-CM

## 2020-06-07 DIAGNOSIS — Z3009 Encounter for other general counseling and advice on contraception: Secondary | ICD-10-CM

## 2020-06-07 DIAGNOSIS — Z113 Encounter for screening for infections with a predominantly sexual mode of transmission: Secondary | ICD-10-CM

## 2020-06-07 DIAGNOSIS — Z01419 Encounter for gynecological examination (general) (routine) without abnormal findings: Secondary | ICD-10-CM

## 2020-06-07 MED ORDER — MEDROXYPROGESTERONE ACETATE 150 MG/ML IM SUSP
150.0000 mg | INTRAMUSCULAR | Status: AC
  Administered 2020-06-07: 150 mg via INTRAMUSCULAR

## 2020-06-07 NOTE — Progress Notes (Signed)
Depo 150mg  given IM in LUOQ without any complications.  Pt given BCCCP pamphlet. , RN

## 2020-06-08 ENCOUNTER — Encounter: Payer: Self-pay | Admitting: Physician Assistant

## 2020-06-08 NOTE — Progress Notes (Addendum)
[facility] DEPARTMENT [facility] [address] Main Number: [phone]    Family Planning Visit- Initial Visit  Subjective:  Stacey Guerra is a 50 y.o.  G4P0   being seen today for an initial annual visit and to discuss contraceptive options.  The patient is currently using Depo Provera for pregnancy prevention. Patient reports she does not want a pregnancy in the next year.  Patient has the following medical conditions has 1 ppd, interested in quiting on 12/15/2019 on their problem list.  Chief Complaint  Patient presents with  . Contraception    Annual visit with pap and continue Depo.    Patient reports that she has been working on quitting smoking and is down to 1/2 pack a day.  Desires to continue with Depo as her BCM.  Per chart review, CBE and pap are due today.   Patient denies any concerns today.    Body mass index is 24.49 kg/m. - Patient is eligible for diabetes screening based on BMI and age >11?  not applicable HA1C ordered? not applicable  Patient reports 0  partner/s in last year. Desires STI screening?  No - not indicated.  Has patient been screened once for HCV in the past?  No  No results found for: HCVAB  Does the patient have current drug use (including MJ), have a partner with drug use, and/or has been incarcerated since last result? No  If yes-- Screen for HCV through [facility]   Does the patient meet criteria for HBV testing? No  Criteria:  -Household, sexual or needle sharing contact with HBV -History of drug use -HIV positive -Those with known Hep C   Health Maintenance Due  Topic Date Due  . COVID-19 Vaccine (1) Never done  . TETANUS/TDAP  Never done  . COLONOSCOPY (Pts 45-86yrs Insurance coverage will need to be confirmed)  Never done  . PAP SMEAR-Modifier  12/23/2019    Review of Systems  All other systems reviewed and are negative.   The following portions of the patient's history were  reviewed and updated as appropriate: allergies, current medications, past family history, past medical history, past social history, past surgical history and problem list. Problem list updated.   See flowsheet for other program required questions.  Objective:   Vitals:   06/07/20 1414  BP: 112/69  Weight: 180 lb 9.6 oz (81.9 kg)  Height: 6' (1.829 m)    Physical Exam Vitals and nursing note reviewed.  Constitutional:      General: She is not in acute distress.    Appearance: Normal appearance.  HENT:     Head: Normocephalic and atraumatic.     Mouth/Throat:     Mouth: Mucous membranes are moist.     Pharynx: Oropharynx is clear. No oropharyngeal exudate or posterior oropharyngeal erythema.  Eyes:     Conjunctiva/sclera: Conjunctivae normal.  Cardiovascular:     Rate and Rhythm: Normal rate and regular rhythm.  Pulmonary:     Effort: Pulmonary effort is normal.     Breath sounds: Normal breath sounds.  Abdominal:     Palpations: Abdomen is soft. There is no mass.     Tenderness: There is no abdominal tenderness. There is no guarding or rebound.  Genitourinary:    General: Normal vulva.     Rectum: Normal.  Musculoskeletal:     Cervical back: Neck supple. No tenderness.  Lymphadenopathy:     Cervical: No cervical adenopathy.  Skin:  General: Skin is warm and dry.     Findings: No bruising, erythema, lesion or rash.  Neurological:     Mental Status: She is alert and oriented to person, place, and time.  Psychiatric:        Mood and Affect: Mood normal.        Behavior: Behavior normal.        Thought Content: Thought content normal.        Judgment: Judgment normal.       Assessment and Plan:  Stacey Guerra is a 50 y.o. female presenting to the [facility] Department for an initial annual wellness/contraceptive visit  Contraception counseling: Reviewed all forms of birth control options in the tiered based approach. available including  abstinence; over the counter/barrier methods; hormonal contraceptive medication including pill, patch, ring, injection,contraceptive implant, ECP; hormonal and nonhormonal IUDs; permanent sterilization options including vasectomy and the various tubal sterilization modalities. Risks, benefits, and typical effectiveness rates were reviewed.  Questions were answered.  Written information was also given to the patient to review.  Patient desires to continue with Depo, this was prescribed for patient. She will follow up in  3 months and prn for surveillance.  She was told to call with any further questions, or with any concerns about this method of contraception.  Emphasized use of condoms 100% of the time for STI prevention.  Patient was not a candidate for ECP today.   1. Encounter for counseling regarding contraception Reviewed with patient normal SE of Depo and when to call clinic for concerns. Enc condoms with all sex for STD protection.  2. Well woman exam with routine gynecological exam Reviewed with patient healthy habits to maintain general health. Counseled patient re: need for Calcium and Vitamin D3 for bone health. Enc weight bearing exercise for bone health. Enc MVI 1 po daily. Enc to establish with/ follow up with PCP for primary care concerns, age appropriate screenings and illness. Stressed to patient that importance of establishing with PCP for age appropriate screenings such as colonoscopy, mammogram, etc. Await pap results.  Counseled patient that RN will call or send a letter  Once results are back.  - IGP, Aptima HPV  3. Surveillance for Depo-Provera contraception Continue with Depo 150 mg IM q 11-13 weeks for 1 year. - medroxyPROGESTERone (DEPO-PROVERA) injection 150 mg     Return in about 3 months (around 09/07/2020) for depo .  No future appointments.  Matt Holmes, PA

## 2020-06-10 LAB — IGP, APTIMA HPV
HPV Aptima: NEGATIVE
PAP Smear Comment: 0

## 2020-10-26 ENCOUNTER — Encounter: Payer: Self-pay | Admitting: Advanced Practice Midwife

## 2020-10-26 ENCOUNTER — Ambulatory Visit (LOCAL_COMMUNITY_HEALTH_CENTER): Payer: Self-pay

## 2020-10-26 ENCOUNTER — Other Ambulatory Visit: Payer: Self-pay

## 2020-10-26 ENCOUNTER — Ambulatory Visit (LOCAL_COMMUNITY_HEALTH_CENTER): Payer: Self-pay | Admitting: Advanced Practice Midwife

## 2020-10-26 VITALS — BP 126/79 | HR 82 | Ht 73.5 in | Wt 181.4 lb

## 2020-10-26 DIAGNOSIS — Z30013 Encounter for initial prescription of injectable contraceptive: Secondary | ICD-10-CM

## 2020-10-26 DIAGNOSIS — F1290 Cannabis use, unspecified, uncomplicated: Secondary | ICD-10-CM | POA: Insufficient documentation

## 2020-10-26 DIAGNOSIS — Z111 Encounter for screening for respiratory tuberculosis: Secondary | ICD-10-CM

## 2020-10-26 DIAGNOSIS — F17200 Nicotine dependence, unspecified, uncomplicated: Secondary | ICD-10-CM | POA: Insufficient documentation

## 2020-10-26 DIAGNOSIS — Z3009 Encounter for other general counseling and advice on contraception: Secondary | ICD-10-CM

## 2020-10-26 MED ORDER — MEDROXYPROGESTERONE ACETATE 150 MG/ML IM SUSP
150.0000 mg | Freq: Once | INTRAMUSCULAR | Status: AC
  Administered 2020-10-26: 150 mg via INTRAMUSCULAR

## 2020-10-26 NOTE — Progress Notes (Signed)
Contraception/Family Planning VISIT ENCOUNTER NOTE  Subjective:   Stacey Guerra is a 50 y.o. SBF smoker G20P0 female here for reproductive life counseling.  Desires DMPA for  BCM.  Reports she does not want a pregnancy in the next year. Denies abnormal vaginal bleeding, discharge, pelvic pain, problems with intercourse or other gynecologic concerns.  Wants to restart DMPA. Last DMPA 06/07/20 (20 1/7). Last sex 05/2020. LMP none since DMPA. Smoker 1 1/2 ppd   Gynecologic History No LMP recorded. Patient has had an injection. Contraception: abstinence  Health Maintenance Due  Topic Date Due   COVID-19 Vaccine (1) Never done   TETANUS/TDAP  Never done   COLONOSCOPY (Pts 45-72yrs Insurance coverage will need to be confirmed)  Never done   MAMMOGRAM  Never done   Zoster Vaccines- Shingrix (1 of 2) Never done   INFLUENZA VACCINE  Never done     The following portions of the patient's history were reviewed and updated as appropriate: allergies, current medications, past family history, past medical history, past social history, past surgical history and problem list.  Review of Systems Pertinent items are noted in HPI.   Objective:  BP 126/79   Pulse 82   Ht 6' 1.5" (1.867 m)   Wt 181 lb 6.4 oz (82.3 kg)   BMI 23.61 kg/m  Gen: well appearing, NAD HEENT: no scleral icterus CV: RR Lung: Normal WOB Ext: warm well perfused     Assessment and Plan:   Contraception counseling: Reviewed all forms of birth control options in the tiered based approach. available including abstinence; over the counter/barrier methods; hormonal contraceptive medication including pill, patch, ring, injection,contraceptive implant, ECP; hormonal and nonhormonal IUDs; permanent sterilization options including vasectomy and the various tubal sterilization modalities. Risks, benefits, and typical effectiveness rates were reviewed.  Questions were answered.  Written information was also given to the patient to  review.  Patient desires reinitiation of DMPA, this was prescribed for patient. She will follow up in  11-13 wsk for surveillance.  She was told to call with any further questions, or with any concerns about this method of contraception.  Emphasized use of condoms 100% of the time for STI prevention.  Patient was offered ECP. ECP was not accepted by the patient. ECP counseling was not given - see RN documentation  1. 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   No follow-ups on file.  Alberteen Spindle, CNM [facility] DEPARTMENT

## 2020-10-26 NOTE — Progress Notes (Signed)
DMPA 150 mg IM (RUOQ) given today ordered by Hazle Coca, CNM x1 time order. 20w 1 d post last depo. Patient tolerated injection well.    Marchelle Folks, SW card given to patient as well.   Physical due 06/08/21.  Next depo due earliest 01/11/21 - Appointment reminder card given.  Patient is also aware to return in 3 days for f/u TB reading.   Floy Sabina, RN

## 2020-10-26 NOTE — Progress Notes (Signed)
Desires to resume Depo as has been 20 weeks and 1 day since last injection. Client reports last intercourse was in 05/2019. Per client, no menses and no vaginal bleeding since last Depo 06/07/2020 (same day as annual physical). Jossie Ng, RN

## 2020-10-29 ENCOUNTER — Ambulatory Visit (LOCAL_COMMUNITY_HEALTH_CENTER): Payer: Self-pay

## 2020-10-29 ENCOUNTER — Other Ambulatory Visit: Payer: Self-pay

## 2020-10-29 DIAGNOSIS — Z111 Encounter for screening for respiratory tuberculosis: Secondary | ICD-10-CM

## 2020-10-29 LAB — TB SKIN TEST
Induration: 0 mm
TB Skin Test: NEGATIVE

## 2021-03-01 ENCOUNTER — Other Ambulatory Visit: Payer: Self-pay

## 2021-03-01 ENCOUNTER — Emergency Department
Admission: EM | Admit: 2021-03-01 | Discharge: 2021-03-01 | Disposition: A | Discharge status: Home / Self Care | Payer: Self-pay | Attending: Emergency Medicine | Admitting: Emergency Medicine

## 2021-03-01 ENCOUNTER — Emergency Department: Payer: Self-pay

## 2021-03-01 DIAGNOSIS — K2970 Gastritis, unspecified, without bleeding: Secondary | ICD-10-CM | POA: Insufficient documentation

## 2021-03-01 DIAGNOSIS — Z20822 Contact with and (suspected) exposure to covid-19: Secondary | ICD-10-CM | POA: Insufficient documentation

## 2021-03-01 DIAGNOSIS — R1013 Epigastric pain: Secondary | ICD-10-CM

## 2021-03-01 LAB — COMPREHENSIVE METABOLIC PANEL
ALT: 35 U/L (ref 0–44)
AST: 33 U/L (ref 15–41)
Albumin: 4.7 g/dL (ref 3.5–5.0)
Alkaline Phosphatase: 67 U/L (ref 38–126)
Anion gap: 14 (ref 5–15)
BUN: 9 mg/dL (ref 6–20)
CO2: 20 mmol/L — ABNORMAL LOW (ref 22–32)
Calcium: 9.2 mg/dL (ref 8.9–10.3)
Chloride: 100 mmol/L (ref 98–111)
Creatinine, Ser: 0.82 mg/dL (ref 0.44–1.00)
GFR, Estimated: >60 mL/min (ref >60)
Glucose, Bld: 86 mg/dL (ref 70–99)
Potassium: 3.4 mmol/L — ABNORMAL LOW (ref 3.5–5.1)
Sodium: 134 mmol/L — ABNORMAL LOW (ref 135–145)
Total Bilirubin: 1 mg/dL (ref 0.3–1.2)
Total Protein: 9 g/dL — ABNORMAL HIGH (ref 6.5–8.1)

## 2021-03-01 LAB — URINALYSIS, ROUTINE W REFLEX MICROSCOPIC
Bilirubin Urine: NEGATIVE
Glucose, UA: NEGATIVE mg/dL
Ketones, ur: 15 mg/dL — AB
Leukocytes,Ua: NEGATIVE
Nitrite: NEGATIVE
Protein, ur: NEGATIVE mg/dL
Specific Gravity, Urine: 1.015 (ref 1.005–1.030)
pH: 7 (ref 5.0–8.0)

## 2021-03-01 LAB — RESP PANEL BY RT-PCR (FLU A&B, COVID) ARPGX2
Influenza A by PCR: NEGATIVE
Influenza B by PCR: NEGATIVE
SARS Coronavirus 2 by RT PCR: NEGATIVE

## 2021-03-01 LAB — CBC
HCT: 47.5 % — ABNORMAL HIGH (ref 36.0–46.0)
Hemoglobin: 16.1 g/dL — ABNORMAL HIGH (ref 12.0–15.0)
MCH: 31.1 pg (ref 26.0–34.0)
MCHC: 33.9 g/dL (ref 30.0–36.0)
MCV: 91.7 fL (ref 80.0–100.0)
Platelets: 236 10*3/uL (ref 150–400)
RBC: 5.18 MIL/uL — ABNORMAL HIGH (ref 3.87–5.11)
RDW: 12.4 % (ref 11.5–15.5)
WBC: 8.3 10*3/uL (ref 4.0–10.5)
nRBC: 0 % (ref 0.0–0.2)

## 2021-03-01 LAB — LIPASE, BLOOD: Lipase: 61 U/L — ABNORMAL HIGH (ref 11–51)

## 2021-03-01 LAB — PREGNANCY, URINE: Preg Test, Ur: NEGATIVE

## 2021-03-01 MED ORDER — PANTOPRAZOLE SODIUM 40 MG IV SOLR
40.0000 mg | Freq: Once | INTRAVENOUS | Status: AC
Start: 2021-03-01 — End: 2021-03-01
  Administered 2021-03-01: 15:00:00 40 mg via INTRAVENOUS
  Filled 2021-03-01: qty 40

## 2021-03-01 MED ORDER — PANTOPRAZOLE SODIUM 20 MG PO TBEC: 20.0000 mg | DELAYED_RELEASE_TABLET | Freq: Every day | ORAL | 0 refills | Status: AC

## 2021-03-01 MED ORDER — SUCRALFATE 1 G PO TABS: 1.0000 g | ORAL_TABLET | Freq: Three times a day (TID) | ORAL | 0 refills | Status: AC

## 2021-03-01 MED ORDER — ONDANSETRON HCL 4 MG/2ML IJ SOLN
4.0000 mg | Freq: Once | INTRAMUSCULAR | Status: AC
  Administered 2021-03-01: 15:00:00 4 mg via INTRAVENOUS
  Filled 2021-03-01: qty 2

## 2021-03-01 MED ORDER — SODIUM CHLORIDE 0.9 % IV BOLUS
1000.0000 mL | Freq: Once | INTRAVENOUS | Status: AC
  Administered 2021-03-01: 15:00:00 1000 mL via INTRAVENOUS

## 2021-03-01 MED ORDER — POTASSIUM CHLORIDE CRYS ER 20 MEQ PO TBCR
40.0000 meq | EXTENDED_RELEASE_TABLET | Freq: Once | ORAL | Status: AC
  Administered 2021-03-01: 16:00:00 40 meq via ORAL
  Filled 2021-03-01: qty 2

## 2021-03-01 NOTE — ED Provider Triage Note (Signed)
Emergency Medicine Provider Triage Evaluation Note  Maren Beach, a 51 y.o. female  was evaluated in triage.  Pt complains of epigastric abdominal pain, nausea, and vomiting.  She now symptoms have been persistent for the last week.  She presented to [facility] for evaluation, and was referred to the ED for further work-up.  Review of Systems  Positive: Epigastric abd pain, NV Negative: FCS  Physical Exam  BP (!) 127/108    Pulse 70    Temp 98.1 F (36.7 C) (Oral)    Resp 18    Ht 6' 1.5" (1.867 m)    Wt 81.6 kg    SpO2 99%    BMI 23.43 kg/m  Gen:   Awake, no distress  uncomfortable Resp:  Normal effort  MSK:   Moves extremities without difficulty  Other:  ABD; soft, nontender  Medical Decision Making  Medically screening exam initiated at 12:33 PM.  Appropriate orders placed.  Charnae A Cressy was informed that the remainder of the evaluation will be completed by another provider, this initial triage assessment does not replace that evaluation, and the importance of remaining in the ED until their evaluation is complete.  Patient ED evaluation of a 1 week complaint of persistent epigastric domino pain with associated nausea and vomiting.   Melvenia Needles, PA-C 03/01/21 1234

## 2021-03-01 NOTE — ED Notes (Signed)
Pt educated to let staff know if she needs to have a BM.

## 2021-03-01 NOTE — Discharge Instructions (Addendum)
This could be gastritis or an ulcer.  We discussed CT imaging we are going to hold off given you were feeling better and tolerating PO.   Can follow-up with GI.  Discuss with them if you needed an endoscopy to take a further look inside her stomach.  Avoid ibuprofen, smoking, alcohol.  She can take Tylenol 1 g every 8 hours.

## 2021-03-01 NOTE — ED Notes (Signed)
Patient given crackers and water for po challenge.

## 2021-03-01 NOTE — ED Provider Notes (Addendum)
[facility] Provider Note    Event Date/Time   First MD Initiated Contact with Patient 03/01/21 1400     (approximate)   History   Abdominal Pain   HPI  Stacey Guerra is a 51 y.o. female who comes in with epigastric pain, nausea, vomiting, diarrhea since last Monday.  Went to urgent care this morning but was referred to the ER.  Patient given Zofran at urgent care.  Patient reports having 1 week of upper abdominal pain that is worse with eating.  States every time she tries to eat she starts to vomit.  She does report marijuana use but has not used for over a week.  Denies any other alcohol or drugs denies any chest pain, shortness of breath, urinary symptoms or other concerns   Physical Exam   Triage Vital Signs: ED Triage Vitals  Enc Vitals Group     BP 03/01/21 1224 (!) 127/108     Pulse Rate 03/01/21 1224 70     Resp 03/01/21 1224 18     Temp 03/01/21 1224 98.1 F (36.7 C)     Temp Source 03/01/21 1224 Oral     SpO2 03/01/21 1227 99 %     Weight 03/01/21 1227 180 lb (81.6 kg)     Height 03/01/21 1227 6' 1.5" (1.867 m)     Head Circumference --      Peak Flow --      Pain Score --      Pain Loc --      Pain Edu? --      Excl. in GC? --     Most recent vital signs: Vitals:   03/01/21 1224 03/01/21 1227  BP: (!) 127/108   Pulse: 70   Resp: 18   Temp: 98.1 F (36.7 C)   SpO2:  99%     General: Awake, no distress.   CV:  Good peripheral perfusion.  Resp:  Normal effort.  Abd:  No distention.  Slightly tender in the right upper/epigastric area.  No rebound or guarding    ED Results / Procedures / Treatments   Labs (all labs ordered are listed, but only abnormal results are displayed) Labs Reviewed  LIPASE, BLOOD - Abnormal; Notable for the following components:      Result Value   Lipase 61 (*)    All other components within normal limits  CBC - Abnormal; Notable for the following components:   RBC 5.18 (*)     Hemoglobin 16.1 (*)    HCT 47.5 (*)    All other components within normal limits  URINALYSIS, ROUTINE W REFLEX MICROSCOPIC  COMPREHENSIVE METABOLIC PANEL  POC URINE PREG, ED     EKG  My interpretation of EKG:  Normal sinus rate of 81 without any ST elevation or T wave versions, normal intervals  RADIOLOGY I have reviewed the ultrasound personally and agree that is normal   PROCEDURES:  Critical Care performed: No  Procedures   MEDICATIONS ORDERED IN ED: Medications  sodium chloride 0.9 % bolus 1,000 mL (1,000 mLs Intravenous New Bag/Given 03/01/21 1500)  ondansetron (ZOFRAN) injection 4 mg (4 mg Intravenous Given 03/01/21 1501)  pantoprazole (PROTONIX) injection 40 mg (40 mg Intravenous Given 03/01/21 1501)     IMPRESSION / MDM / ASSESSMENT AND PLAN / ED COURSE  I reviewed the triage vital signs and the nursing notes.  Differential diagnosis includes, but is not limited to, gastritis, gallbladder pathology.  Will get ultrasound to further evaluate and labs.  Consider pregnancy.   LFTs show slightly elevated lipase which is nonspecific. White count is normal no signs for infection.  Hemoglobin stable CMP slightly low potassium we will give some oral repletion.  Ultrasound with  no evidence of gallstones  Reevaluated patient.  Patient report feeling much better.  Her abdomen is soft and nontender.  We discussed CT imaging but at this time I feel it would have low utility.  She has no weight loss and seems less likely to be mass or something like that.  It sounds like this is more likely acid reflux versus gastritis versus ulcer.  She will need GI follow-up for endoscopy.  She like to hold off on CT at this time she is going to try p.o. challenge and patient be handed off to oncoming team if she fails p.o. challenge or she is starting to feel worse we can consider CT imaging otherwise patient will most likely be discharged home  Repeat abdominal  exam soft nontender without any rebound or guarding.  I believe suspicion for perforation.  Patient is well-appearing  Reevaluated patient.  She states that she feels a little bit of nausea but reports otherwise feeling much better than previous.  Again we discussed CT imaging but she would like to hold off.  She is going to follow-up with GI discussed avoiding THC, smoking, ibuprofen, alcohol.  She will return to the ER if her symptoms are getting worse  Patient tolerating p.o. and is comfortable with discharge home at this time    FINAL CLINICAL IMPRESSION(S) / ED DIAGNOSES   Final diagnoses:  Epigastric abdominal pain  Gastritis without bleeding, unspecified chronicity, unspecified gastritis type     Rx / DC Orders   ED Discharge Orders          Ordered    pantoprazole (PROTONIX) 20 MG tablet  Daily        03/01/21 1533    sucralfate (CARAFATE) 1 g tablet  3 times daily with meals & bedtime        03/01/21 1533             Note:  This document was prepared using Dragon voice recognition software and may include unintentional dictation errors.   Concha Se, MD 03/01/21 1535    Concha Se, MD 03/01/21 1613    Concha Se, MD 03/01/21 [id number]

## 2021-03-01 NOTE — ED Triage Notes (Signed)
Pt to ED via POV from home. Pt c/o epigastric pain, N/V/D since last Monday. Pt seen at UC this morning but was referred to the ED. Pt given zofran at UC.

## 2021-03-17 ENCOUNTER — Ambulatory Visit: Payer: Self-pay | Admitting: Gastroenterology

## 2021-07-28 ENCOUNTER — Ambulatory Visit: Payer: Self-pay

## 2021-08-04 ENCOUNTER — Encounter: Payer: Self-pay | Admitting: Physician Assistant

## 2021-08-04 ENCOUNTER — Ambulatory Visit (LOCAL_COMMUNITY_HEALTH_CENTER): Payer: Self-pay | Admitting: Physician Assistant

## 2021-08-04 VITALS — BP 122/73 | Ht 73.0 in | Wt 169.0 lb

## 2021-08-04 DIAGNOSIS — Z3009 Encounter for other general counseling and advice on contraception: Secondary | ICD-10-CM

## 2021-08-04 DIAGNOSIS — Z87891 Personal history of nicotine dependence: Secondary | ICD-10-CM

## 2021-08-04 DIAGNOSIS — Z01419 Encounter for gynecological examination (general) (routine) without abnormal findings: Secondary | ICD-10-CM

## 2021-08-04 DIAGNOSIS — G43909 Migraine, unspecified, not intractable, without status migrainosus: Secondary | ICD-10-CM | POA: Insufficient documentation

## 2021-08-04 DIAGNOSIS — Z30013 Encounter for initial prescription of injectable contraceptive: Secondary | ICD-10-CM

## 2021-08-04 MED ORDER — MEDROXYPROGESTERONE ACETATE 150 MG/ML IM SUSP
150.0000 mg | INTRAMUSCULAR | Status: AC
  Administered 2021-08-04 – 2021-11-02 (×2): 150 mg via INTRAMUSCULAR

## 2021-08-04 NOTE — Progress Notes (Signed)
[facility] DEPARTMENT [facility] [address] Main Number: [phone]    Family Planning Visit- Initial Visit  Subjective:  Stacey Guerra is a 51 y.o.  [id number]   being seen today for an initial annual visit and to discuss reproductive life planning.  The patient is currently using Abstinence for pregnancy prevention. Patient reports   does not want a pregnancy in the next year.     report they are looking for a method that provides Other weight gain.  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  Chief Complaint  Patient presents with   Annual Exam    Patient reports she feels well, had first period since discontinuing DMPA several years ago in the last 2 weeks. Quit smoking 6 mo ago. No h/o mammo or colon CA screening.  Patient denies any problems or concerns.   Body mass index is 22.3 kg/m. - Patient is eligible for diabetes screening based on BMI and age >51?  no HA1C ordered? not applicable  Patient reports 0  partner/s in last year. Desires STI screening?  No - no risk factors.  Has patient been screened once for HCV in the past?  Yes Neg 01/29/2019  No results found for: "HCVAB"  Does the patient have current drug use (including MJ), have a partner with drug use, and/or has been incarcerated since last result? No  If yes-- Screen for HCV through [facility]   Does the patient meet criteria for HBV testing? No  Criteria:  -Household, sexual or needle sharing contact with HBV -History of drug use -HIV positive -Those with known Hep C   Health Maintenance Due  Topic Date Due   COVID-19 Vaccine (1) Never done   COLONOSCOPY (Pts 45-20yrs Insurance coverage will need to be confirmed)  Never done   TETANUS/TDAP  09/27/2015   MAMMOGRAM  Never done   Zoster Vaccines- Shingrix (1 of 2) Never done    Review of Systems   Constitutional: Negative.   HENT: Negative.    Eyes: Negative.   Respiratory: Negative.    Cardiovascular: Negative.   Gastrointestinal: Negative.   Genitourinary: Negative.   Musculoskeletal: Negative.   Skin: Negative.   Neurological: Negative.   Endo/Heme/Allergies: Negative.   Psychiatric/Behavioral: Negative.      The following portions of the patient's history were reviewed and updated as appropriate: allergies, current medications, past family history, past medical history, past social history, past surgical history and problem list. Problem list updated.   See flowsheet for other program required questions.  Objective:   Vitals:   08/04/21 1600  BP: 122/73  Weight: 169 lb (76.7 kg)  Height: 6\' 1"  (1.854 m)    Physical Exam Constitutional:      Appearance: Normal appearance. She is normal weight.  HENT:     Head: Normocephalic and atraumatic.  Cardiovascular:     Rate and Rhythm: Normal rate and regular rhythm.     Heart sounds: Normal heart sounds.  Pulmonary:     Effort: Pulmonary effort is normal.     Breath sounds: Normal breath sounds.  Abdominal:     Palpations: Abdomen is soft.  Musculoskeletal:        General: Normal range of motion.  Skin:    General: Skin is warm and dry.  Neurological:     General: No focal deficit present.     Mental  Status: She is alert and oriented to person, place, and time.  Psychiatric:        Mood and Affect: Mood normal.        Behavior: Behavior normal.        Judgment: Judgment normal.       Assessment and Plan:  Stacey Guerra is a 51 y.o. female presenting to the [facility] Department for an initial annual wellness/contraceptive visit  Contraception counseling: Reviewed options based on patient desire and reproductive life plan. Patient is interested in Hormonal Injection. This was provided to the patient today.  Risks, benefits, and typical effectiveness rates were reviewed.  Questions were  answered.  Written information was also given to the patient to review.    The patient will follow up in  3 months for surveillance.  The patient was told to call with any further questions, or with any concerns about this method of contraception.  Emphasized use of condoms 100% of the time for STI prevention.  Need for ECP was assessed. Patient reported > 120 hours .  Reviewed options and patient desired No method of ECP, declined all    1. Family planning services Restart DMPA today. Enc calcium-rich diet and weight-bearing exercise. Counseled re: limits of prescription beyond menopause at [facility]. Pt currently not post-menopausal per menses earlier this month. - medroxyPROGESTERone (DEPO-PROVERA) injection 150 mg  2. Well woman exam with routine gynecological exam Counseled importance of initiating colon and breast cancer screening. Pt wil have health insurance through new job in about 6 weeks and is encouraged to get these vital screening scheduled by Sept 2023. Continue regular dental exams. Pap due 2027.  3. 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 Reinforced quit decision and smoking abstinence.  Return in about 3 months (around 11/04/2021) for Routine DMPA injection.  No future appointments.  Landry Dyke, PA-C

## 2021-08-04 NOTE — Progress Notes (Signed)
Patient here for yearly PE and wants to restart Depo. Burt Knack, RN

## 2021-08-04 NOTE — Progress Notes (Signed)
Depo given, right deltoid, tolerated well, next Depo card given. [facility] pamphlet given.Burt Knack, RN

## 2021-11-02 ENCOUNTER — Ambulatory Visit (LOCAL_COMMUNITY_HEALTH_CENTER): Payer: Self-pay

## 2021-11-02 VITALS — BP 129/88 | Ht 73.0 in | Wt 174.0 lb

## 2021-11-02 DIAGNOSIS — Z3009 Encounter for other general counseling and advice on contraception: Secondary | ICD-10-CM

## 2021-11-02 DIAGNOSIS — Z3042 Encounter for surveillance of injectable contraceptive: Secondary | ICD-10-CM

## 2021-11-02 NOTE — Progress Notes (Signed)
12 week 6 days post dep.  Voices no concerns.  Depo given today per order by Lora Havens PA-C dated 08/04/2021.  Tolerated well in RUOQ.  Next Depo due 01/18/2022. Valente Fosberg Shelda Pal, RN

## 2023-10-24 ENCOUNTER — Ambulatory Visit: Payer: Self-pay

## 2023-10-24 DIAGNOSIS — Z113 Encounter for screening for infections with a predominantly sexual mode of transmission: Secondary | ICD-10-CM

## 2023-10-24 DIAGNOSIS — A599 Trichomoniasis, unspecified: Secondary | ICD-10-CM

## 2023-10-24 LAB — WET PREP FOR TRICH, YEAST, CLUE
Clue Cell Exam: NEGATIVE
Trichomonas Exam: POSITIVE — AB
Yeast Exam: NEGATIVE

## 2023-10-24 LAB — HM HIV SCREENING LAB: HM HIV Screening: NEGATIVE

## 2023-10-24 MED ORDER — METRONIDAZOLE 500 MG PO TABS: 500.0000 mg | ORAL_TABLET | Freq: Two times a day (BID) | ORAL | Status: AC

## 2023-10-24 NOTE — Progress Notes (Signed)
 [facility] Department STI clinic [address] [place] [address] Main phone: [phone]  STI screening visit  Subjective:  Stacey Guerra is a 53 y.o. female being seen today for an STI screening visit. The patient reports they do not have symptoms. Patient is post-menopausal.  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 First time being sexually active since 2020. Recently quit smoking, familiar with quit line.  Does the patient using douching products? No  See flowsheet for further details and programmatic requirements Hyperlink available at the top of the signed note in blue.  Flow sheet content below:  Pregnancy Intention Screening Does the patient want to become pregnant in the next year?: No Does the patient's partner want to become pregnant in the next year?: No Would the patient like to discuss contraceptive options today?: Yes Reason For STD Screen STD Screening: Is asymptomatic Have you ever had an STD?: Yes History of Antibiotic use in the past 2 weeks?: No STD Symptoms Denies all: Yes Risk Factors for Hep B Household, sexual, or needle sharing contact of a person infected with Hep B: No Sexual contact with a person who uses drugs not as prescribed?: No Currently or Ever used drugs not as prescribed: No HIV Positive: No PRep Patient: No Men who have sex with men: No Have Hepatitis C: No History of Incarceration: No History of Homeslessness?: No Anal sex following anal drug use?: No Risk Factors for Hep C Currently using drugs not as prescribed:  No Sexual partner(s) currently using drugs as not prescribed: No History of drug use: No HIV Positive: No People with a history of incarceration: No People born between the years of 39 and 43: No Abuse History Has patient ever been abused physically?: No Has patient ever been abused sexually?: No Does patient feel they have a problem with Anxiety?: No Does patient feel they have a problem with Depression?: No Counseling Patient counseled to use condoms with all sex: Condoms declined Test results given to patient Patient counseled to use condoms with all sex: Condoms declined   Screening for MPX risk: Does the patient have an unexplained rash? No Is the patient MSM? No Does the patient endorse multiple sex partners or anonymous sex partners? No Did the patient have close or sexual contact with a person diagnosed with MPX? No Has the patient traveled outside the [place]  where MPX is endemic? No Is there a high clinical suspicion for MPX-- evidenced by one of the following No  -Unlikely to be chickenpox  -Lymphadenopathy  -Rash that present in same phase of evolution on any given body part  Screenings: Last HIV test per patient/review of record was  Lab Results  Component Value Date   HMHIVSCREEN Negative - Validated 01/29/2019   No results found for: HIV   Last HEPC test per patient/review of record was  Lab Results  Component Value Date   HMHEPCSCREEN Negative-Validated 01/29/2019   No components found for: HEPC   Last HEPB test per patient/review of record was No components found for: HMHEPBSCREEN   Patient reports  last pap was:   Lab Results  Component Value Date   SPECADGYN Comment 06/07/2020   Result Date Procedure Results Follow-ups  06/07/2020 IGP, Aptima HPV DIAGNOSIS:: Comment Specimen adequacy:: Comment Clinician Provided ICD10: Comment Performed by:: Comment PAP Smear Comment: . Note:: Comment Test Methodology: Comment HPV Aptima: Negative    12/23/2014 HM PAP SMEAR HM Pap smear: Negative, HPV negative    Immunization history:  Immunization History  Administered Date(s) Administered   PPD Test 12/16/2019, 10/26/2020   Tdap 09/26/2005    The following portions of the patient's history were reviewed and updated as appropriate: allergies, current medications, past medical history, past social history, past surgical history and problem list.  Objective:  There were no vitals filed for this visit.  Physical Exam Constitutional:      Appearance: Normal appearance.  HENT:     Head: Normocephalic.     Mouth/Throat:     Lips: Pink. No lesions.     Mouth: Mucous membranes are moist.     Pharynx: Oropharynx is clear. No pharyngeal swelling, oropharyngeal exudate or posterior oropharyngeal erythema.  Eyes:     General: No scleral icterus.       Right eye: No discharge.        Left eye: No discharge.  Pulmonary:     Effort: Pulmonary effort is normal.  Lymphadenopathy:     Cervical: No cervical adenopathy.  Skin:    General: Skin is warm and dry.     Findings: No rash.  Neurological:     Mental Status: She is alert.  Psychiatric:        Mood and Affect: Mood normal.        Behavior: Behavior normal.    Assessment and Plan:  [name] is a 53 y.o. female presenting to the [facility] Department for STI screening  1. Screening for venereal disease (Primary)  - Chlamydia/Gonorrhea [facility] - WET PREP FOR TRICH, YEAST, CLUE - HIV [place] [facility] - Syphilis Serology, [place] [facility]  1. Screening for venereal disease (Primary)  - Chlamydia/Gonorrhea [facility] - WET PREP FOR TRICH, YEAST, CLUE - HIV [place] [facility] - Syphilis Serology,  Lab  2. Trichomonas infection  - metroNIDAZOLE  (FLAGYL ) 500 MG tablet; Take 1 tablet (500 mg total) by mouth 2 (two) times daily for 7 days.   Patient accepted the following screenings: vaginal CT/GC swab, vaginal wet prep, HIV, and RPR Patient meets  criteria for HepB screening? No. Ordered? no Patient meets criteria for HepC screening? No. Ordered? no  Treat wet prep per standing order Discussed time line for [facility] results and that patient will be called with positive results and encouraged patient to call if she had not heard in 2 weeks.  Counseled to return or seek care for continued or worsening symptoms Recommended repeat testing in 3 months with positive results. Recommended condom use with all sex for STI prevention.   Return in about 3 months (around 01/23/2024).  No future appointments.  Damien FORBES Satchel, NP

## 2023-10-24 NOTE — Progress Notes (Signed)
 patient is here for STD screening. Wet prep results reviewed with patient and was dispensed metronidazole  500 mg tablets 2x/day for 7 days.Contact card and brochure given. condoms declined. Kwadwo Vinisha Faxon,RN.

## 2023-10-29 ENCOUNTER — Ambulatory Visit: Payer: Self-pay
